# Patient Record
Sex: Female | Born: 1995 | Race: Black or African American | Hispanic: No | Marital: Single | Smoking: Current some day smoker
Health system: Southern US, Community
[De-identification: ages and names within clinical notes are randomized; demographics above are authoritative.]

## PROBLEM LIST (undated history)

## (undated) ENCOUNTER — Emergency Department (HOSPITAL_COMMUNITY): Admission: EM | Payer: Self-pay

---
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
## 2015-01-04 ENCOUNTER — Encounter (HOSPITAL_COMMUNITY): Payer: Self-pay

## 2015-01-04 ENCOUNTER — Emergency Department (HOSPITAL_COMMUNITY): Payer: Managed Care, Other (non HMO)

## 2015-01-04 ENCOUNTER — Emergency Department (HOSPITAL_COMMUNITY)
Admission: EM | Admit: 2015-01-04 | Discharge: 2015-01-05 | Disposition: A | Discharge status: Home / Self Care | Payer: Managed Care, Other (non HMO) | Attending: Emergency Medicine | Admitting: Emergency Medicine

## 2015-01-04 DIAGNOSIS — Y9389 Activity, other specified: Secondary | ICD-10-CM | POA: Insufficient documentation

## 2015-01-04 DIAGNOSIS — Y92410 Unspecified street and highway as the place of occurrence of the external cause: Secondary | ICD-10-CM | POA: Insufficient documentation

## 2015-01-04 DIAGNOSIS — Y998 Other external cause status: Secondary | ICD-10-CM | POA: Insufficient documentation

## 2015-01-04 DIAGNOSIS — S8391XA Sprain of unspecified site of right knee, initial encounter: Secondary | ICD-10-CM | POA: Insufficient documentation

## 2015-01-04 DIAGNOSIS — Z3202 Encounter for pregnancy test, result negative: Secondary | ICD-10-CM | POA: Diagnosis not present

## 2015-01-04 DIAGNOSIS — S8991XA Unspecified injury of right lower leg, initial encounter: Secondary | ICD-10-CM | POA: Diagnosis present

## 2015-01-04 LAB — POC URINE PREG, ED: Preg Test, Ur: NEGATIVE

## 2015-01-04 MED ORDER — OXYCODONE-ACETAMINOPHEN 5-325 MG PO TABS
1.0000 | ORAL_TABLET | Freq: Once | ORAL | Status: AC
  Administered 2015-01-04: 1 via ORAL
  Filled 2015-01-04: qty 1
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
## 2015-01-05 ENCOUNTER — Emergency Department (HOSPITAL_COMMUNITY): Payer: Managed Care, Other (non HMO)

## 2015-01-05 DIAGNOSIS — S8391XA Sprain of unspecified site of right knee, initial encounter: Secondary | ICD-10-CM | POA: Diagnosis not present

## 2015-01-05 MED ORDER — OXYCODONE-ACETAMINOPHEN 5-325 MG PO TABS: 1.0000 | ORAL_TABLET | Freq: Four times a day (QID) | ORAL | Status: DC | PRN

## 2015-01-05 MED ORDER — IBUPROFEN 600 MG PO TABS: 600.0000 mg | ORAL_TABLET | Freq: Four times a day (QID) | ORAL | Status: DC | PRN

## 2015-01-05 MED ORDER — OXYCODONE-ACETAMINOPHEN 5-325 MG PO TABS
1.0000 | ORAL_TABLET | Freq: Once | ORAL | Status: AC
  Administered 2015-01-05: 1 via ORAL
  Filled 2015-01-05: qty 1

## 2015-01-05 NOTE — Discharge Instructions (Signed)

## 2015-01-05 NOTE — ED Notes (Signed)
Pt was able to ambulate in hall. Pt was limping, but laughing throughout ambulation.

## 2016-10-02 ENCOUNTER — Emergency Department (HOSPITAL_COMMUNITY)
Admission: EM | Admit: 2016-10-02 | Discharge: 2016-10-02 | Disposition: A | Discharge status: Home / Self Care | Payer: Managed Care, Other (non HMO)

## 2016-10-02 NOTE — ED Notes (Signed)
Registration advised that pt told them she was going to [facility] due to wait,

## 2019-03-27 ENCOUNTER — Other Ambulatory Visit: Payer: Self-pay

## 2019-03-27 ENCOUNTER — Ambulatory Visit: Payer: BC Managed Care – PPO | Attending: Internal Medicine

## 2019-03-27 DIAGNOSIS — Z20822 Contact with and (suspected) exposure to covid-19: Secondary | ICD-10-CM | POA: Insufficient documentation

## 2019-03-28 LAB — NOVEL CORONAVIRUS, NAA: SARS-CoV-2, NAA: NOT DETECTED

## 2019-07-29 ENCOUNTER — Encounter (HOSPITAL_COMMUNITY): Payer: Self-pay | Admitting: Emergency Medicine

## 2019-07-29 ENCOUNTER — Emergency Department (HOSPITAL_COMMUNITY): Payer: BC Managed Care – PPO

## 2019-07-29 ENCOUNTER — Other Ambulatory Visit: Payer: Self-pay

## 2019-07-29 ENCOUNTER — Emergency Department (HOSPITAL_COMMUNITY)
Admission: EM | Admit: 2019-07-29 | Discharge: 2019-07-29 | Disposition: A | Discharge status: Home / Self Care | Payer: BC Managed Care – PPO | Attending: Emergency Medicine | Admitting: Emergency Medicine

## 2019-07-29 DIAGNOSIS — Y939 Activity, unspecified: Secondary | ICD-10-CM | POA: Diagnosis not present

## 2019-07-29 DIAGNOSIS — S93402A Sprain of unspecified ligament of left ankle, initial encounter: Secondary | ICD-10-CM

## 2019-07-29 DIAGNOSIS — Y999 Unspecified external cause status: Secondary | ICD-10-CM | POA: Diagnosis not present

## 2019-07-29 DIAGNOSIS — X58XXXA Exposure to other specified factors, initial encounter: Secondary | ICD-10-CM | POA: Diagnosis not present

## 2019-07-29 DIAGNOSIS — Y929 Unspecified place or not applicable: Secondary | ICD-10-CM | POA: Insufficient documentation

## 2019-07-29 DIAGNOSIS — S99912A Unspecified injury of left ankle, initial encounter: Secondary | ICD-10-CM | POA: Diagnosis present

## 2019-07-29 NOTE — Discharge Instructions (Signed)
Return if any problems.

## 2019-07-29 NOTE — ED Triage Notes (Signed)
Pt c/o left ankle swelling and pain since x 2 days, denies injury

## 2019-07-29 NOTE — ED Provider Notes (Signed)
[facility] EMERGENCY DEPARTMENT Provider Note   CSN: [id number] Arrival date & time: 07/29/19  1132     History Chief Complaint  Patient presents with  . Ankle Pain    Nancy Lyons is a 24 y.o. female.  The history is provided by the patient. No language interpreter was used.  Ankle Pain Location:  Ankle Injury: no   Pain details:    Quality:  Aching   Radiates to:  Does not radiate   Severity:  Moderate   Onset quality:  Gradual   Timing:  Constant   Progression:  Worsening Chronicity:  New Dislocation: no   Foreign body present:  No foreign bodies Prior injury to area:  Unable to specify Relieved by:  Nothing Worsened by:  Nothing Ineffective treatments:  Ice Risk factors: no known bone disorder        History reviewed. No pertinent past medical history.  There are no problems to display for this patient.   History reviewed. No pertinent surgical history.   OB History   No obstetric history on file.     History reviewed. No pertinent family history.  Social History   Tobacco Use  . Smoking status: Never Smoker  . Smokeless tobacco: Never Used  Vaping Use  . Vaping Use: Never used  Substance Use Topics  . Alcohol use: No  . Drug use: No    Home Medications Prior to Admission medications   Medication Sig Start Date End Date Taking? Authorizing Provider  ibuprofen (ADVIL,MOTRIN) 600 MG tablet Take 1 tablet (600 mg total) by mouth every 6 (six) hours as needed. 01/05/15   Horton, Mayer Masker, MD  oxyCODONE-acetaminophen (PERCOCET/ROXICET) 5-325 MG tablet Take 1 tablet by mouth every 6 (six) hours as needed for severe pain. 01/05/15   Horton, Mayer Masker, MD    Allergies    Patient has no known allergies.  Review of Systems   Review of Systems  All other systems reviewed and are negative.   Physical Exam Updated Vital Signs BP 116/72 (BP Location: Right Arm)   Pulse 64   Temp 98.1 F (36.7 C) (Oral)   Resp 16   Ht 6' (1.829 m)    Wt 102.1 kg   LMP 07/24/2019   SpO2 100%   BMI 30.52 kg/m   Physical Exam Vitals and nursing note reviewed.  Constitutional:      Appearance: She is well-developed.  HENT:     Head: Normocephalic.  Abdominal:     General: There is no distension.  Musculoskeletal:        General: Swelling and tenderness present.     Cervical back: Normal range of motion.     Comments: Swollen tender left ankle,  Pain with movement, nv and ns intact   Skin:    General: Skin is warm.     Capillary Refill: Capillary refill takes less than 2 seconds.  Neurological:     Mental Status: She is alert and oriented to person, place, and time.  Psychiatric:        Mood and Affect: Mood normal.     ED Results / Procedures / Treatments   Labs (all labs ordered are listed, but only abnormal results are displayed) Labs Reviewed - No data to display  EKG None  Radiology DG Ankle Complete Left  Result Date: 07/29/2019 CLINICAL DATA:  Acute left ankle pain and swelling without known injury. EXAM: LEFT ANKLE COMPLETE - 3+ VIEW COMPARISON:  None. FINDINGS: There is no evidence  of fracture, dislocation, or joint effusion. There is no evidence of arthropathy or other focal bone abnormality. Soft tissues are unremarkable. IMPRESSION: Negative. Electronically Signed   By: Lupita Raider M.D.   On: 07/29/2019 13:12    Procedures Procedures (including critical care time)  Medications Ordered in ED Medications - No data to display  ED Course  I have reviewed the triage vital signs and the nursing notes.  Pertinent labs & imaging results that were available during my care of the patient were reviewed by me and considered in my medical decision making (see chart for details).    MDM Rules/Calculators/A&P                          MDM: Final Clinical Impression(s) / ED Diagnoses Final diagnoses:  Sprain of left ankle, unspecified ligament, initial encounter    Rx / DC Orders ED Discharge Orders     None    An After Visit Summary was printed and given to the patient.    Elson Areas, [place] 07/29/19 1653    Bethann Berkshire, MD 07/29/19 2314

## 2019-10-02 ENCOUNTER — Other Ambulatory Visit: Payer: BC Managed Care – PPO

## 2019-10-02 ENCOUNTER — Other Ambulatory Visit: Payer: Self-pay

## 2019-10-02 DIAGNOSIS — Z20822 Contact with and (suspected) exposure to covid-19: Secondary | ICD-10-CM

## 2019-10-04 LAB — NOVEL CORONAVIRUS, NAA: SARS-CoV-2, NAA: NOT DETECTED

## 2020-07-25 ENCOUNTER — Ambulatory Visit: Payer: BC Managed Care – PPO

## 2020-09-05 ENCOUNTER — Other Ambulatory Visit: Payer: Self-pay

## 2020-09-05 ENCOUNTER — Emergency Department (HOSPITAL_COMMUNITY): Payer: Self-pay

## 2020-09-05 ENCOUNTER — Emergency Department (HOSPITAL_COMMUNITY)
Admission: EM | Admit: 2020-09-05 | Discharge: 2020-09-05 | Disposition: A | Discharge status: Home / Self Care | Payer: Self-pay | Attending: Emergency Medicine | Admitting: Emergency Medicine

## 2020-09-05 ENCOUNTER — Encounter (HOSPITAL_COMMUNITY): Payer: Self-pay | Admitting: *Deleted

## 2020-09-05 DIAGNOSIS — N898 Other specified noninflammatory disorders of vagina: Secondary | ICD-10-CM | POA: Insufficient documentation

## 2020-09-05 DIAGNOSIS — N730 Acute parametritis and pelvic cellulitis: Secondary | ICD-10-CM

## 2020-09-05 DIAGNOSIS — N739 Female pelvic inflammatory disease, unspecified: Secondary | ICD-10-CM | POA: Insufficient documentation

## 2020-09-05 DIAGNOSIS — A599 Trichomoniasis, unspecified: Secondary | ICD-10-CM | POA: Insufficient documentation

## 2020-09-05 LAB — URINALYSIS, ROUTINE W REFLEX MICROSCOPIC
Bilirubin Urine: NEGATIVE
Glucose, UA: NEGATIVE mg/dL
Hgb urine dipstick: NEGATIVE
Ketones, ur: NEGATIVE mg/dL
Nitrite: NEGATIVE
Protein, ur: NEGATIVE mg/dL
Specific Gravity, Urine: 1.02 (ref 1.005–1.030)
pH: 5 (ref 5.0–8.0)

## 2020-09-05 LAB — CBC WITH DIFFERENTIAL/PLATELET
Abs Immature Granulocytes: 0.02 10*3/uL (ref 0.00–0.07)
Basophils Absolute: 0 10*3/uL (ref 0.0–0.1)
Basophils Relative: 1 %
Eosinophils Absolute: 0.2 10*3/uL (ref 0.0–0.5)
Eosinophils Relative: 3 %
HCT: 34 % — ABNORMAL LOW (ref 36.0–46.0)
Hemoglobin: 10.3 g/dL — ABNORMAL LOW (ref 12.0–15.0)
Immature Granulocytes: 0 %
Lymphocytes Relative: 32 %
Lymphs Abs: 1.9 10*3/uL (ref 0.7–4.0)
MCH: 23.8 pg — ABNORMAL LOW (ref 26.0–34.0)
MCHC: 30.3 g/dL (ref 30.0–36.0)
MCV: 78.7 fL — ABNORMAL LOW (ref 80.0–100.0)
Monocytes Absolute: 0.6 10*3/uL (ref 0.1–1.0)
Monocytes Relative: 10 %
Neutro Abs: 3.3 10*3/uL (ref 1.7–7.7)
Neutrophils Relative %: 54 %
Platelets: 457 10*3/uL — ABNORMAL HIGH (ref 150–400)
RBC: 4.32 MIL/uL (ref 3.87–5.11)
RDW: 17.9 % — ABNORMAL HIGH (ref 11.5–15.5)
WBC: 6.1 10*3/uL (ref 4.0–10.5)
nRBC: 0 % (ref 0.0–0.2)

## 2020-09-05 LAB — BASIC METABOLIC PANEL
Anion gap: 4 — ABNORMAL LOW (ref 5–15)
BUN: 9 mg/dL (ref 6–20)
CO2: 24 mmol/L (ref 22–32)
Calcium: 8.8 mg/dL — ABNORMAL LOW (ref 8.9–10.3)
Chloride: 106 mmol/L (ref 98–111)
Creatinine, Ser: 0.74 mg/dL (ref 0.44–1.00)
GFR, Estimated: >60 mL/min (ref >60)
Glucose, Bld: 108 mg/dL — ABNORMAL HIGH (ref 70–99)
Potassium: 3.9 mmol/L (ref 3.5–5.1)
Sodium: 134 mmol/L — ABNORMAL LOW (ref 135–145)

## 2020-09-05 LAB — ABO/RH: ABO/RH(D): B POS

## 2020-09-05 LAB — WET PREP, GENITAL
Sperm: NONE SEEN
Yeast Wet Prep HPF POC: NONE SEEN

## 2020-09-05 LAB — PREGNANCY, URINE: Preg Test, Ur: NEGATIVE

## 2020-09-05 LAB — HCG, QUANTITATIVE, PREGNANCY: hCG, Beta Chain, Quant, S: 1 m[IU]/mL (ref <5)

## 2020-09-05 MED ORDER — METRONIDAZOLE 500 MG PO TABS: 500.0000 mg | ORAL_TABLET | Freq: Two times a day (BID) | ORAL | 0 refills | Status: DC

## 2020-09-05 MED ORDER — DOXYCYCLINE HYCLATE 100 MG PO CAPS: 100.0000 mg | ORAL_CAPSULE | Freq: Two times a day (BID) | ORAL | 0 refills | Status: DC

## 2020-09-05 MED ORDER — CEFTRIAXONE SODIUM 500 MG IJ SOLR
500.0000 mg | Freq: Once | INTRAMUSCULAR | Status: AC
  Administered 2020-09-05: 500 mg via INTRAMUSCULAR
  Filled 2020-09-05: qty 500

## 2020-09-05 MED ORDER — STERILE WATER FOR INJECTION IJ SOLN
INTRAMUSCULAR | Status: AC
  Filled 2020-09-05: qty 10

## 2020-09-05 MED ORDER — IOHEXOL 350 MG/ML SOLN
85.0000 mL | Freq: Once | INTRAVENOUS | Status: AC | PRN
  Administered 2020-09-05: 85 mL via INTRAVENOUS

## 2020-09-05 MED ORDER — DEXTROSE 5 % IV SOLN
500.0000 mg | Freq: Once | INTRAVENOUS | Status: DC
  Filled 2020-09-05: qty 500

## 2020-09-05 NOTE — ED Provider Notes (Signed)
[facility] EMERGENCY DEPARTMENT Provider Note   CSN: [id number] Arrival date & time: 09/05/20  1330     History Chief Complaint  Patient presents with   Urinary Frequency   Abdominal Pain    Nancy Lyons is a 25 y.o. female.   Urinary Frequency Associated symptoms include abdominal pain. Pertinent negatives include no chest pain, no headaches and no shortness of breath.  Abdominal Pain Associated symptoms: vaginal discharge   Associated symptoms: no chest pain, no chills, no diarrhea, no dysuria, no fatigue, no fever, no hematuria, no nausea, no shortness of breath, no sore throat, no vaginal bleeding and no vomiting       Nancy Lyons is a 25 y.o. female who presents to the Emergency Department complaining of symptoms of pain with urination and vaginal spotting with intercourse and right lower quadrant abdominal pain.  She states that she has had symptoms for several days.  She endorses increased urinary frequency without burning.  She notes having some increased yellow vaginal discharge as well.  New sexual partner since May.  Recently unprotected intercourse with same person.  Abdominal pain is nonradiating.  No flank pain, fever or chills.  States that nothing makes her abdominal pain better or worse.  She denies abnormal menses, genital lesions and back pain.  Unsure if she may be pregnant.    History reviewed. No pertinent past medical history.  There are no problems to display for this patient.   History reviewed. No pertinent surgical history.   OB History   No obstetric history on file.     No family history on file.  Social History   Tobacco Use   Smoking status: Never   Smokeless tobacco: Never  Vaping Use   Vaping Use: Never used  Substance Use Topics   Alcohol use: No   Drug use: No    Home Medications Prior to Admission medications   Not on File    Allergies    Patient has no known allergies.  Review of Systems   Review of  Systems  Constitutional:  Negative for chills, fatigue and fever.  HENT:  Negative for sore throat and trouble swallowing.   Respiratory:  Negative for shortness of breath.   Cardiovascular:  Negative for chest pain.  Gastrointestinal:  Positive for abdominal pain. Negative for blood in stool, diarrhea, nausea and vomiting.  Genitourinary:  Positive for frequency and vaginal discharge. Negative for decreased urine volume, difficulty urinating, dysuria, flank pain, hematuria and vaginal bleeding.  Musculoskeletal:  Negative for arthralgias, back pain, myalgias, neck pain and neck stiffness.  Skin:  Negative for rash.  Neurological:  Negative for dizziness, syncope, weakness, numbness and headaches.  Hematological:  Does not bruise/bleed easily.  Psychiatric/Behavioral:  Negative for confusion.    Physical Exam Updated Vital Signs BP 109/61   Pulse 62   Temp 98 F (36.7 C) (Oral)   Resp 18   SpO2 95%   Physical Exam Vitals and nursing note reviewed. Exam conducted with a chaperone present.  Constitutional:      General: She is not in acute distress.    Appearance: Normal appearance. She is well-developed. She is not ill-appearing.  HENT:     Mouth/Throat:     Mouth: Mucous membranes are moist.  Cardiovascular:     Rate and Rhythm: Normal rate and regular rhythm.     Pulses: Normal pulses.  Pulmonary:     Effort: Pulmonary effort is normal.     Breath sounds: Normal breath  sounds. No wheezing.  Abdominal:     Palpations: Abdomen is soft.     Tenderness: There is abdominal tenderness. There is no guarding or rebound.     Comments: Mild tenderness to palpation of the right lower quadrant.  No guarding or rebound tenderness.  No CVA tenderness on exam.  Remaining abdomen is soft nontender.  Genitourinary:    Exam position: Supine.     Labia:        Right: No rash or lesion.        Left: No rash or lesion.      Vagina: Vaginal discharge present.     Cervix: Cervical motion  tenderness present. No cervical bleeding.     Uterus: Normal. Not enlarged.      Adnexa:        Right: Tenderness present. No mass.         Left: No mass or tenderness.       Comments: Copious amounts of thin white vaginal discharge present on exam. Right adnexal tenderness without appreciable fullness.  Some cervical motion tenderness also appreciated on exam. Musculoskeletal:        General: Normal range of motion.     Cervical back: Normal range of motion and neck supple.  Skin:    General: Skin is warm.     Findings: No rash.  Neurological:     General: No focal deficit present.     Mental Status: She is alert.     Sensory: No sensory deficit.     Motor: No weakness.    ED Results / Procedures / Treatments   Labs (all labs ordered are listed, but only abnormal results are displayed) Labs Reviewed  URINALYSIS, ROUTINE W REFLEX MICROSCOPIC - Abnormal; Notable for the following components:      Result Value   Leukocytes,Ua TRACE (*)    Bacteria, UA RARE (*)    All other components within normal limits  CBC WITH DIFFERENTIAL/PLATELET - Abnormal; Notable for the following components:   Hemoglobin 10.3 (*)    HCT 34.0 (*)    MCV 78.7 (*)    MCH 23.8 (*)    RDW 17.9 (*)    Platelets 457 (*)    All other components within normal limits  BASIC METABOLIC PANEL - Abnormal; Notable for the following components:   Sodium 134 (*)    Glucose, Bld 108 (*)    Calcium 8.8 (*)    Anion gap 4 (*)    All other components within normal limits  WET PREP, GENITAL  PREGNANCY, URINE  HCG, QUANTITATIVE, PREGNANCY  RPR  ABO/RH  GC/CHLAMYDIA PROBE AMP ([place]) NOT AT [facility]    EKG None  Radiology CT ABDOMEN PELVIS W CONTRAST  Result Date: 09/05/2020 CLINICAL DATA:  Right lower quadrant pain, appendicitis suspected, urinary frequency EXAM: CT ABDOMEN AND PELVIS WITH CONTRAST TECHNIQUE: Multidetector CT imaging of the abdomen and pelvis was performed using the standard protocol  following bolus administration of intravenous contrast. CONTRAST:  79mL OMNIPAQUE IOHEXOL 350 MG/ML SOLN COMPARISON:  None. FINDINGS: Lower chest: No acute abnormality. Hepatobiliary: No solid liver abnormality is seen. No gallstones, gallbladder wall thickening, or biliary dilatation. Pancreas: Unremarkable. No pancreatic ductal dilatation or surrounding inflammatory changes. Spleen: Normal in size without significant abnormality. Adrenals/Urinary Tract: Adrenal glands are unremarkable. Kidneys are normal, without renal calculi, solid lesion, or hydronephrosis. Bladder is unremarkable. Stomach/Bowel: Stomach is within normal limits. Appendix appears normal (series 2, image 65). No evidence of bowel wall thickening,  distention, or inflammatory changes. Vascular/Lymphatic: No significant vascular findings are present. No enlarged abdominal or pelvic lymph nodes. Reproductive: No mass or other significant abnormality. Small bilateral ovarian cysts and follicles. Other: No abdominal wall hernia or abnormality. No abdominopelvic ascites. Musculoskeletal: No acute or significant osseous findings. IMPRESSION: No acute CT findings of the abdomen or pelvis to explain right lower quadrant pain. Normal appendix. Electronically Signed   By: Lauralyn Primes M.D.   On: 09/05/2020 17:40    Procedures Procedures   Medications Ordered in ED Medications  cefTRIAXone (ROCEPHIN) 500 mg in dextrose 5 % 50 mL IVPB (has no administration in time range)  iohexol (OMNIPAQUE) 350 MG/ML injection 85 mL (85 mLs Intravenous Contrast Given 09/05/20 1721)    ED Course  I have reviewed the triage vital signs and the nursing notes.  Pertinent labs & imaging results that were available during my care of the patient were reviewed by me and considered in my medical decision making (see chart for details).    MDM Rules/Calculators/A&P                           Patient here with complaint of right lower abdominal pain, pain with  urination and vaginal spotting during intercourse.  She also endorses having increased vaginal discharge.  No fever or vomiting.  On exam, patient does have some mild tenderness of the right lower quadrant.  No guarding or rebound tenderness.  Abdomen is soft.  Vital signs are reassuring.  Patient does not appear toxic.  Given that she does have right lower quadrant pain, I will proceed with labs and CT of the abdomen pelvis.  I do feel that her symptoms are most likely GU related.  CT abdomen pelvis without acute findings.  Appendix is normal.  Labs interpreted by me, no leukocytosis.  Hemoglobin 10.3, near patient baseline.  No significant electrolyte derangement.  Patient is not pregnant.  Urinalysis without evidence of infection.  Pelvic exam reveals excessive thin white vaginal discharge and tenderness at the right adnexa and cervical motion tenderness.  There is no palpable masses.  Wet prep shows trichomonas.  I suspect patient has mild PID.  Plan includes treatment here with Rocephin and prescriptions for Flagyl and doxycycline.  Discussed that sexual partners will also need to be tested and/or treated.  Patient verbalized understanding.  She is nontoxic-appearing and I feel she is appropriate for discharge home.  Discussed need for follow-up with [facility] or health department for recheck.  Final Clinical Impression(s) / ED Diagnoses Final diagnoses:  PID (acute pelvic inflammatory disease)  Trichomonosis    Rx / DC Orders ED Discharge Orders     None        Rosey Bath 09/06/20 2125    Bethann Berkshire, MD 09/12/20 [phone]

## 2020-09-05 NOTE — Discharge Instructions (Addendum)
Your test today showed that you likely have a pelvic infection and trichomonas.  You have been treated with antibiotics here and prescriptions written for 2 additional antibiotics that you will need to take as directed until they are finished.  Follow-up with your primary care provider or with the health department for recheck.  All sexual partners will need to be tested as well.  This may be done at the health department.

## 2020-09-05 NOTE — ED Provider Notes (Signed)
Emergency Medicine Provider Triage Evaluation Note  Nancy Lyons , a 25 y.o. female  was evaluated in triage.  Pt complains of right lower quadrant discomfort and dysuria, this has been going on for approximately 5 days, she states it is worse when she has intercourse, there is also spotting with intercourse, she is not using any protection.  Review of Systems  Positive: Abdominal discomfort, dysuria Negative: Diarrhea, fever, chills, cough, shortness of breath  Physical Exam  BP 111/71   Pulse 70   Temp 98.4 F (36.9 C) (Oral)   Resp 18   SpO2 96%  Gen:   Awake, no distress no distress, well-appearing Resp:  Normal effort normal lung sounds MSK:   Moves extremities without difficulty no edema Abdomen:  Mild tenderness right lower quadrant, no CVA tenderness Neurologic:  Normal speech and coordination Medical Decision Making  Medically screening exam initiated at 2:20 PM.  Appropriate orders placed.  Nancy Lyons was informed that the remainder of the evaluation will be completed by another provider, this initial triage assessment does not replace that evaluation, and the importance of remaining in the ED until their evaluation is complete.  Needs work-up for possible pregnancy, possible urinary tract infection, possible ovarian pathology, this does not seem consistent with ovarian torsion, less likely to be appendicitis given the triggers of the pain including dysuria and intercourse.  Consider ectopic, labs ordered   Eber Hong, MD 09/05/20 1421

## 2020-09-05 NOTE — ED Triage Notes (Signed)
Right lower quadrant pain with urinary frequency

## 2020-09-06 LAB — GC/CHLAMYDIA PROBE AMP (~~LOC~~) NOT AT ARMC
Chlamydia: POSITIVE — AB
Comment: NEGATIVE
Comment: NORMAL
Neisseria Gonorrhea: NEGATIVE

## 2020-09-06 LAB — RPR: RPR Ser Ql: NONREACTIVE

## 2021-04-16 ENCOUNTER — Emergency Department (HOSPITAL_COMMUNITY)
Admission: EM | Admit: 2021-04-16 | Discharge: 2021-04-17 | Disposition: A | Discharge status: Home / Self Care | Payer: BC Managed Care – PPO | Attending: Emergency Medicine | Admitting: Emergency Medicine

## 2021-04-16 ENCOUNTER — Other Ambulatory Visit: Payer: Self-pay

## 2021-04-16 ENCOUNTER — Encounter (HOSPITAL_COMMUNITY): Payer: Self-pay | Admitting: *Deleted

## 2021-04-16 DIAGNOSIS — H5712 Ocular pain, left eye: Secondary | ICD-10-CM | POA: Diagnosis present

## 2021-04-16 DIAGNOSIS — B9789 Other viral agents as the cause of diseases classified elsewhere: Secondary | ICD-10-CM | POA: Diagnosis not present

## 2021-04-16 DIAGNOSIS — H1089 Other conjunctivitis: Secondary | ICD-10-CM | POA: Insufficient documentation

## 2021-04-16 DIAGNOSIS — H1032 Unspecified acute conjunctivitis, left eye: Secondary | ICD-10-CM

## 2021-04-16 MED ORDER — FLUORESCEIN SODIUM 1 MG OP STRP
1.0000 | ORAL_STRIP | Freq: Once | OPHTHALMIC | Status: AC
  Administered 2021-04-17: 1 via OPHTHALMIC
  Filled 2021-04-16: qty 1

## 2021-04-16 MED ORDER — TETRACAINE HCL 0.5 % OP SOLN
2.0000 [drp] | Freq: Once | OPHTHALMIC | Status: AC
  Administered 2021-04-17: 2 [drp] via OPHTHALMIC
  Filled 2021-04-16: qty 4

## 2021-04-16 NOTE — ED Triage Notes (Signed)
Pt with left eye pain since yesterday.  Redness and swelling noted to left eye.  Pt states it was crusted over this morning.  ?

## 2021-04-17 MED ORDER — ERYTHROMYCIN 5 MG/GM OP OINT
TOPICAL_OINTMENT | Freq: Three times a day (TID) | OPHTHALMIC | Status: DC
  Administered 2021-04-17: 1 via OPHTHALMIC
  Filled 2021-04-17: qty 3.5

## 2021-04-17 NOTE — ED Provider Notes (Signed)
?  [facility] EMERGENCY DEPARTMENT ?Provider Note ? ? ?CSN: [id number] ?Arrival date & time: 04/16/21  2007 ? ?  ? ?History ? ?Chief Complaint  ?Patient presents with  ? Eye Pain  ? ? ?Nancy Lyons is a 26 y.o. female. ? ?The history is provided by the patient.  ?Eye Pain ?This is a new problem. The current episode started yesterday. The problem occurs constantly. The problem has been gradually worsening. Nothing aggravates the symptoms. Nothing relieves the symptoms.  ?Patient reports over a day ago she began having swelling, redness and drainage from her left eye.  No trauma.  She does not wear contact ?It occurred at work but she does not recall getting any foreign bodies in her eye. ?No previous eye surgery ?  ? ?Home Medications ?Prior to Admission medications   ?Not on File  ?   ? ?Allergies    ?Patient has no known allergies.   ? ?Review of Systems   ?Review of Systems  ?Constitutional:  Negative for fever.  ?Eyes:  Positive for pain.  ? ?Physical Exam ?Updated Vital Signs ?BP (!) 111/59 (BP Location: Right Arm)   Pulse (!) 56   Temp 97.6 ?F (36.4 ?C) (Oral)   Resp 20   Ht 1.829 m (6')   Wt 112.5 kg   LMP 04/09/2021   SpO2 100%   BMI 33.63 kg/m?  ?Physical Exam ?CONSTITUTIONAL: Well developed/well nourished ?HEAD: Normocephalic/atraumatic ?EYES: EOMI/PERRL, OS-no corneal abrasion, no foreign bodies.  No corneal haziness.  Mild erythema is noted.  Crust is noted on the eyelashes.  No proptosis.  No evidence of keratitis ?Visual acuity 20/20 bilaterally ?NEURO: Pt is awake/alert/appropriate, moves all extremitiesx4.  No facial droop.   ?EXTREMITIES: full ROM ?SKIN: warm, color normal ?PSYCH: no abnormalities of mood noted, alert and oriented to situation ? ?ED Results / Procedures / Treatments   ?Labs ?(all labs ordered are listed, but only abnormal results are displayed) ?Labs Reviewed - No data to display ? ?EKG ?None ? ?Radiology ?No results found. ? ?Procedures ?Procedures  ? ? ?Medications  Ordered in ED ?Medications  ?erythromycin ophthalmic ointment (has no administration in time range)  ?fluorescein ophthalmic strip 1 strip (1 strip Both Eyes Given 04/17/21 0032)  ?tetracaine (PONTOCAINE) 0.5 % ophthalmic solution 2 drop (2 drops Both Eyes Given 04/17/21 0032)  ? ? ?ED Course/ Medical Decision Making/ A&P ?  ?                        ?Medical Decision Making ?Risk ?Prescription drug management. ? ? ?Patient presents with left eye pain and swelling.  She reports she wakes up with crusting drainage from left eye.  We will treat for acute conjunctivitis.  Patient will be given erythromycin at discharge.  If no improvement in a week she can follow-up with ophthalmology ? ? ? ? ? ? ? ?Final Clinical Impression(s) / ED Diagnoses ?Final diagnoses:  ?Acute bacterial conjunctivitis of left eye  ? ? ?Rx / DC Orders ?ED Discharge Orders   ? ? None  ? ?  ? ? ?  ?Zadie Rhine, MD ?04/17/21 [phone] ? ?

## 2021-04-17 NOTE — ED Notes (Signed)
Visual acuity checked better than 20/20 both eyes ?

## 2021-12-23 IMAGING — DX DG ANKLE COMPLETE 3+V*L*
3 series · 3 of 3 positions shown · non-contrast
Comparison: None.

CLINICAL DATA: Acute left ankle pain and swelling without known
injury.

EXAM:
LEFT ANKLE COMPLETE - 3+ VIEW

[ankle ap]
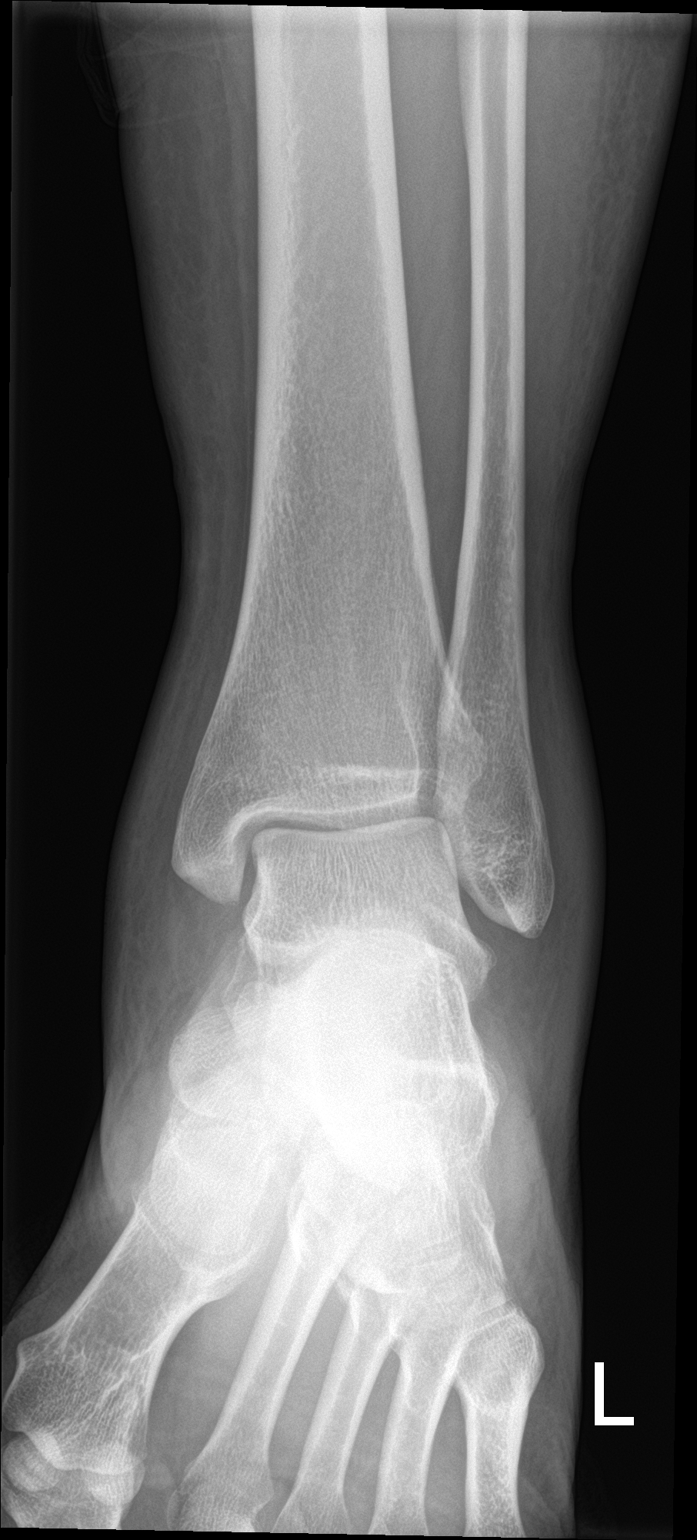

[ankle obl]
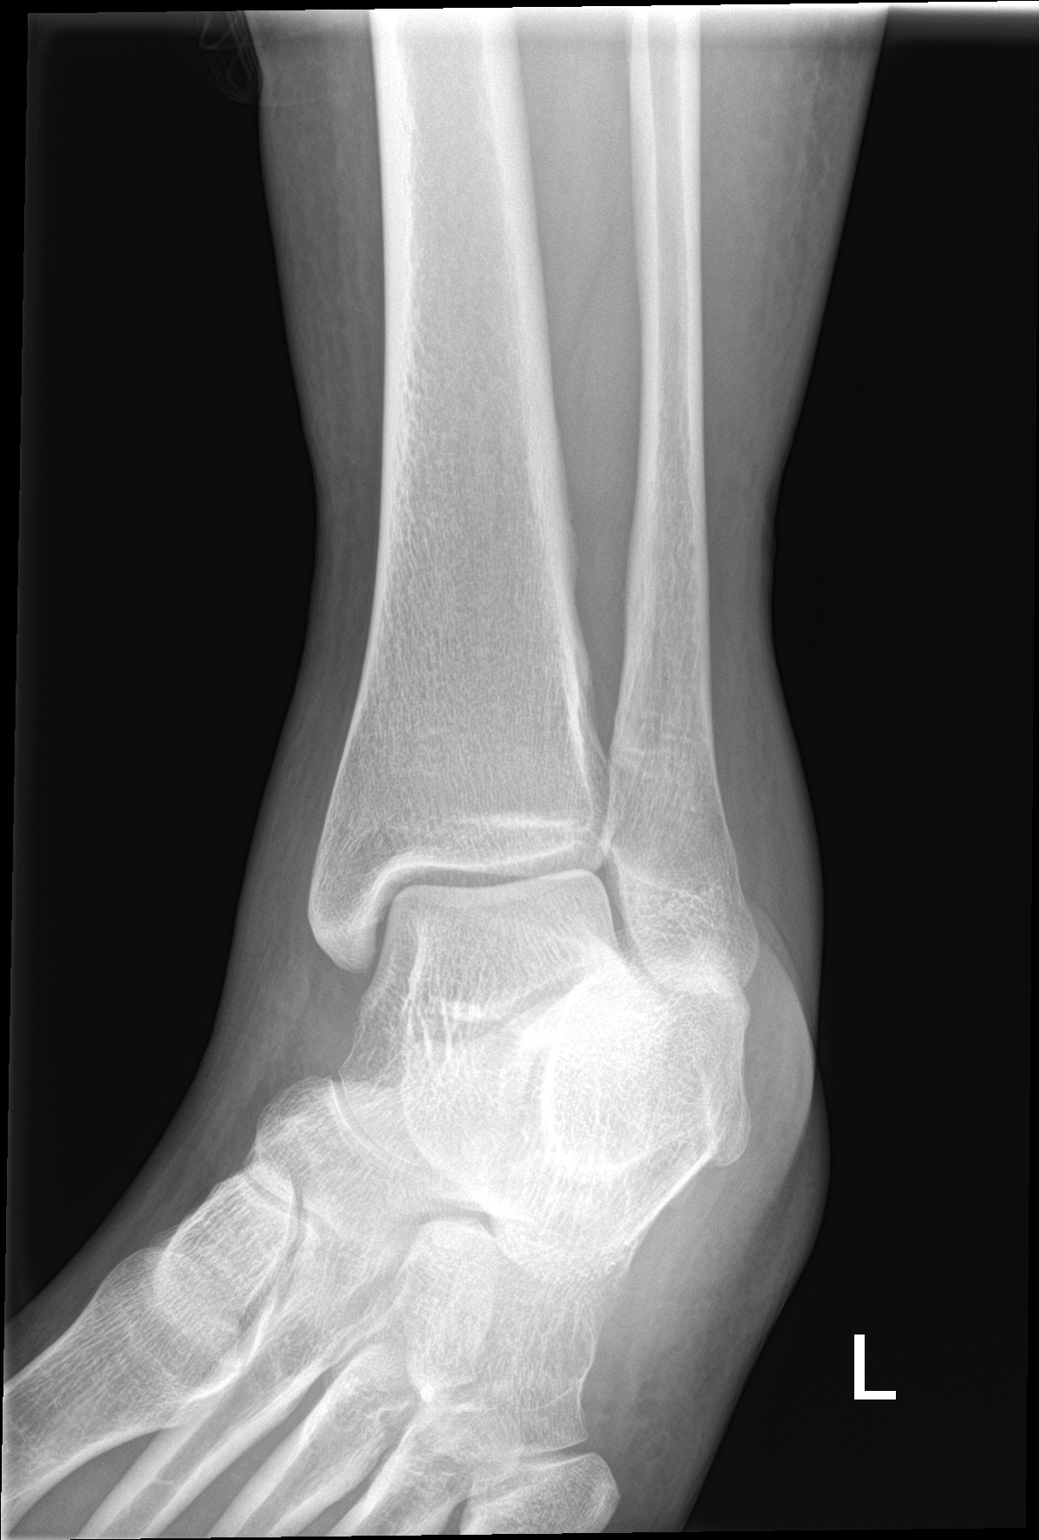

[ankle lat]
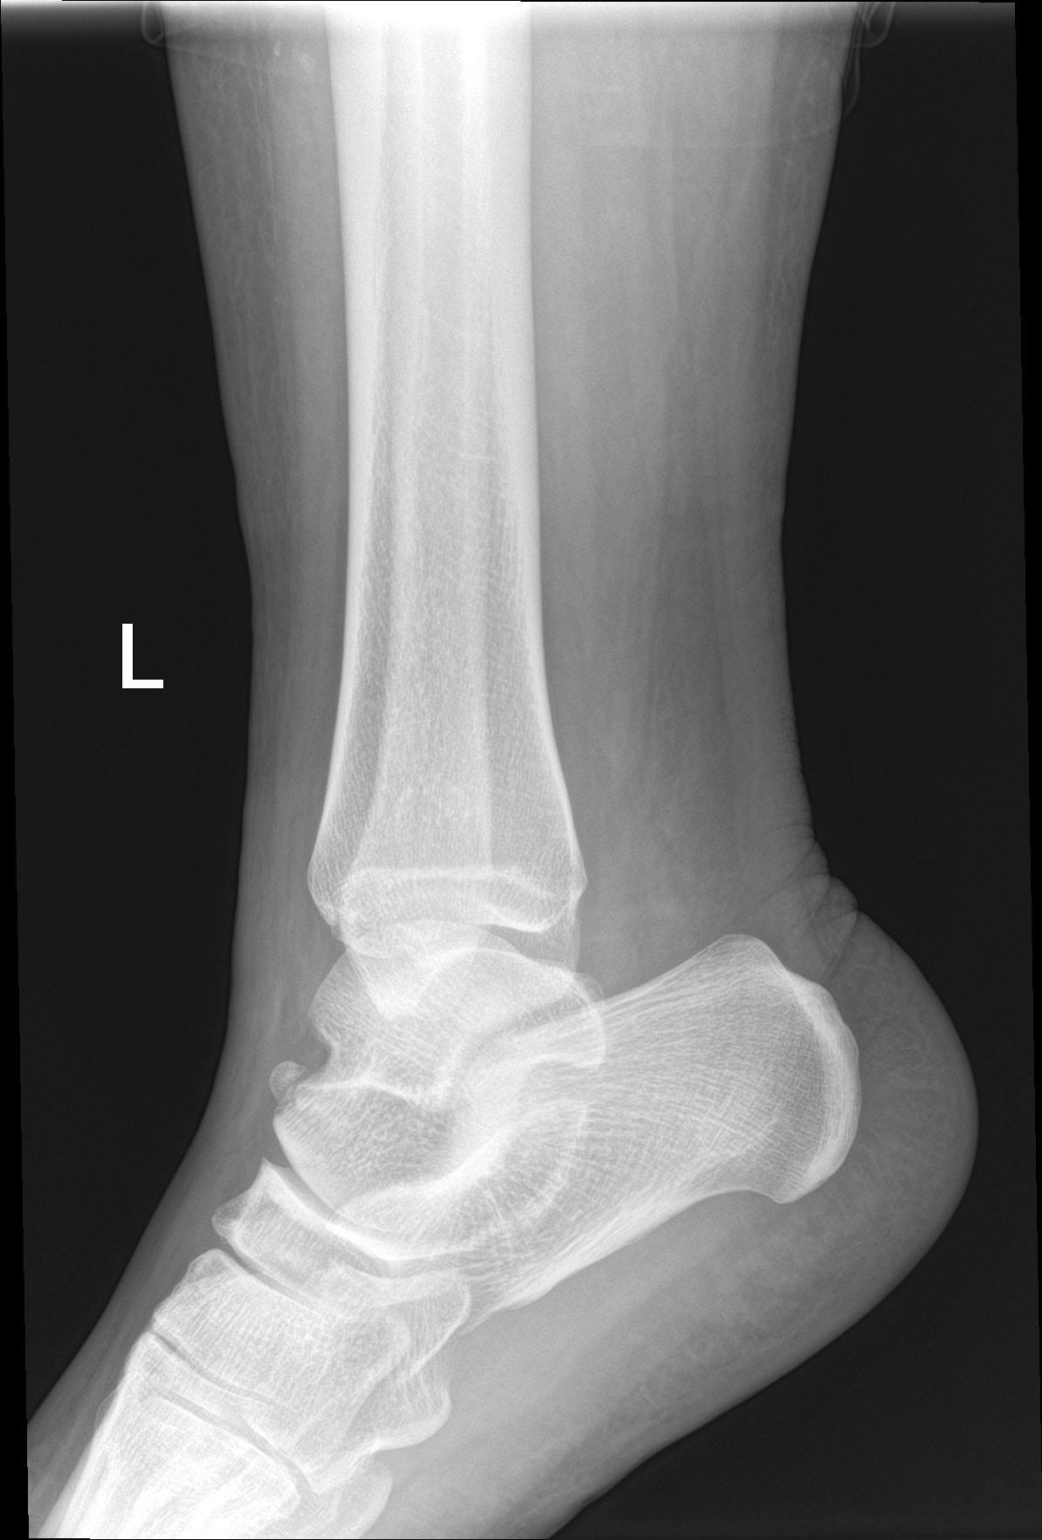

[3 of 3 positions shown; findings below may reference images not displayed]

FINDINGS: There is no evidence of fracture, dislocation, or joint effusion.
There is no evidence of arthropathy or other focal bone abnormality.
Soft tissues are unremarkable.
IMPRESSION: Negative.

## 2022-04-06 ENCOUNTER — Emergency Department (HOSPITAL_COMMUNITY)
Admission: EM | Admit: 2022-04-06 | Discharge: 2022-04-06 | Disposition: A | Discharge status: Home / Self Care | Payer: BC Managed Care – PPO | Attending: Emergency Medicine | Admitting: Emergency Medicine

## 2022-04-06 ENCOUNTER — Emergency Department (HOSPITAL_COMMUNITY): Payer: BC Managed Care – PPO

## 2022-04-06 ENCOUNTER — Other Ambulatory Visit: Payer: Self-pay

## 2022-04-06 DIAGNOSIS — W230XXA Caught, crushed, jammed, or pinched between moving objects, initial encounter: Secondary | ICD-10-CM | POA: Insufficient documentation

## 2022-04-06 DIAGNOSIS — S6992XA Unspecified injury of left wrist, hand and finger(s), initial encounter: Secondary | ICD-10-CM | POA: Diagnosis present

## 2022-04-06 DIAGNOSIS — S60222A Contusion of left hand, initial encounter: Secondary | ICD-10-CM | POA: Insufficient documentation

## 2022-04-06 MED ORDER — CEPHALEXIN 500 MG PO CAPS: ORAL_CAPSULE | ORAL | 0 refills | Status: DC

## 2022-04-06 MED ORDER — IBUPROFEN 800 MG PO TABS: 800.0000 mg | ORAL_TABLET | Freq: Three times a day (TID) | ORAL | 0 refills | Status: DC | PRN

## 2022-04-06 NOTE — ED Provider Notes (Signed)
[facility] Provider Note   CSN: [id number] Arrival date & time: 04/06/22  1432     History  Chief Complaint  Patient presents with   Hand Injury    Nancy Lyons is a 27 y.o. female.  Patient has a past medical history of obesity.  Patient states that her left hand was crushed between a car and a tire when they were trying to work on the tires.  The history is provided by the patient and medical records. No language interpreter was used.  Hand Injury Upper extremity pain location: Right hand. Pain details:    Quality:  Aching   Radiates to:  Does not radiate   Severity:  Moderate   Onset quality:  Sudden   Timing:  Constant Handedness:  Right-handed Dislocation: no   Foreign body present:  No foreign bodies Associated symptoms: no back pain and no fatigue    Contusion to left hand.  She is given Keflex to prevent infection.  She has a small nonsuturable laceration.  Also she is given Motrin and will follow-up with orthopedics    Home Medications Prior to Admission medications   Medication Sig Start Date End Date Taking? Authorizing Provider  cephALEXin (KEFLEX) 500 MG capsule Take on bid 04/06/22  Yes Milton Ferguson, MD  ibuprofen (ADVIL) 800 MG tablet Take 1 tablet (800 mg total) by mouth every 8 (eight) hours as needed for moderate pain. 04/06/22  Yes Milton Ferguson, MD      Allergies    Patient has no known allergies.    Review of Systems   Review of Systems  Constitutional:  Negative for appetite change and fatigue.  HENT:  Negative for congestion, ear discharge and sinus pressure.   Eyes:  Negative for discharge.  Respiratory:  Negative for cough.   Cardiovascular:  Negative for chest pain.  Gastrointestinal:  Negative for abdominal pain and diarrhea.  Genitourinary:  Negative for frequency and hematuria.  Musculoskeletal:  Negative for back pain.       Left hand pain  Skin:  Negative for rash.  Neurological:   Negative for seizures and headaches.  Psychiatric/Behavioral:  Negative for hallucinations.     Physical Exam Updated Vital Signs BP 125/71   Pulse 97   Temp 98.1 F (36.7 C) (Oral)   Resp 17   Ht 6' (1.829 m)   Wt 115.2 kg   LMP 03/28/2022   SpO2 96%   BMI 34.45 kg/m  Physical Exam Vitals and nursing note reviewed.  Constitutional:      Appearance: She is well-developed.  HENT:     Head: Normocephalic.     Nose: Nose normal.  Eyes:     Conjunctiva/sclera: Conjunctivae normal.  Neck:     Trachea: No tracheal deviation.  Cardiovascular:     Rate and Rhythm: Normal rate.     Heart sounds: No murmur heard. Pulmonary:     Effort: Pulmonary effort is normal.  Musculoskeletal:        General: Normal range of motion.     Comments: Swollen left TM with small nonsuturable laceration posteriorly  Skin:    General: Skin is warm.  Neurological:     Mental Status: She is alert and oriented to person, place, and time.     ED Results / Procedures / Treatments   Labs (all labs ordered are listed, but only abnormal results are displayed) Labs Reviewed - No data to display  EKG None  Radiology  DG Hand Complete Left  Result Date: 04/06/2022 CLINICAL DATA:  Left hand pain after injury. EXAM: LEFT HAND - COMPLETE 3+ VIEW COMPARISON:  None Available. FINDINGS: There is no evidence of fracture or dislocation. There is no evidence of arthropathy or other focal bone abnormality. Soft tissues are unremarkable. IMPRESSION: Negative. Electronically Signed   By: Marijo Conception M.D.   On: 04/06/2022 15:29    Procedures Procedures    Medications Ordered in ED Medications - No data to display  ED Course/ Medical Decision Making/ A&P                             Medical Decision Making Amount and/or Complexity of Data Reviewed Radiology: ordered.  Risk Prescription drug management.  Patient with contusion to left hand.  She is given pain medicines and an Ace wrap and will  follow-up with orthopedics.  Patient also put on Keflex to prevent infection from the small laceration        Final Clinical Impression(s) / ED Diagnoses Final diagnoses:  Contusion of left hand, initial encounter    Rx / DC Orders ED Discharge Orders          Ordered    cephALEXin (KEFLEX) 500 MG capsule        04/06/22 1602    ibuprofen (ADVIL) 800 MG tablet  Every 8 hours PRN        04/06/22 1602              Milton Ferguson, MD 04/07/22 1207

## 2022-04-06 NOTE — ED Triage Notes (Signed)
States a she was helping to change a tire on a car when the car fell off the jack & onto her left hand about 20 minutes ago. States had to jack the car back up to get it off her hand.

## 2022-04-06 NOTE — Discharge Instructions (Addendum)
Follow-up in 1 to 2 weeks for recheck.  He can follow-up with Dr. Aline Brochure or one of his colleagues.  Or you also have been referred to Dr. Alvan Dame in [place]

## 2022-04-07 ENCOUNTER — Emergency Department (HOSPITAL_COMMUNITY)
Admission: EM | Admit: 2022-04-07 | Discharge: 2022-04-07 | Discharge status: Against Medical Advice | Payer: BC Managed Care – PPO | Attending: Emergency Medicine | Admitting: Emergency Medicine

## 2022-04-07 DIAGNOSIS — M79642 Pain in left hand: Secondary | ICD-10-CM | POA: Diagnosis present

## 2022-04-07 DIAGNOSIS — Z5321 Procedure and treatment not carried out due to patient leaving prior to being seen by health care provider: Secondary | ICD-10-CM | POA: Insufficient documentation

## 2022-04-12 ENCOUNTER — Ambulatory Visit (INDEPENDENT_AMBULATORY_CARE_PROVIDER_SITE_OTHER): Payer: BC Managed Care – PPO | Admitting: Orthopaedic Surgery

## 2022-04-12 ENCOUNTER — Encounter: Payer: Self-pay | Admitting: Orthopaedic Surgery

## 2022-04-12 VITALS — BP 117/79 | HR 65 | Ht 72.0 in | Wt 250.8 lb

## 2022-04-12 DIAGNOSIS — S6702XA Crushing injury of left thumb, initial encounter: Secondary | ICD-10-CM | POA: Diagnosis not present

## 2022-04-12 MED ORDER — NAPROXEN 500 MG PO TABS: 500.0000 mg | ORAL_TABLET | Freq: Two times a day (BID) | ORAL | 5 refills | Status: DC

## 2022-04-12 NOTE — Progress Notes (Signed)
Subjective:    Patient ID: Nancy Lyons, female    DOB: March 08, 1995, 27 y.o.   MRN: [id number]  HPI She was changing a car tire when the jack collapsed.  A part of the jack hit her left nondominant hand very hard on the thumb and dorsal part of the hand in the web area.  She had abrasions and pain.  This happened on 04-06-22.  She was seen in the ER.  She was evaluated and had X-rays.  X-rays were negative.  She has persistent pain and swelling of the left hand over the thumb area and the web space.  She went to work but is not doing well.  She works in the cold room at work.  She has no other trauma. She has tried ice, ibuprofen and rest with little help. She has slight numbness of the thumb.   Review of Systems  Constitutional:  Positive for activity change.  Musculoskeletal:  Positive for arthralgias and joint swelling.  All other systems reviewed and are negative. For Review of Systems, all other systems reviewed and are negative.  The following is a summary of the past history medically, past history surgically, known current medicines, social history and family history.  This information is gathered electronically by the computer from prior information and documentation.  I review this each visit and have found including this information at this point in the chart is beneficial and informative.   History reviewed. No pertinent past medical history.  History reviewed. No pertinent surgical history.  No current outpatient medications on file prior to visit.   No current facility-administered medications on file prior to visit.    Social History   Socioeconomic History   Marital status: Single    Spouse name: Not on file   Number of children: Not on file   Years of education: Not on file   Highest education level: Not on file  Occupational History   Not on file  Tobacco Use   Smoking status: Never   Smokeless tobacco: Never  Vaping Use   Vaping Use: Never used   Substance and Sexual Activity   Alcohol use: No   Drug use: No   Sexual activity: Not on file  Other Topics Concern   Not on file  Social History Narrative   Not on file   Social Determinants of Health   Financial Resource Strain: Not on file  Food Insecurity: Not on file  Transportation Needs: Not on file  Physical Activity: Not on file  Stress: Not on file  Social Connections: Not on file  Intimate Partner Violence: Not on file    History reviewed. No pertinent family history.  BP 117/79   Pulse 65   Ht 6' (1.829 m)   Wt 250 lb 12.8 oz (113.8 kg)   LMP 03/28/2022   BMI 34.01 kg/m   Body mass index is 34.01 kg/m.      Objective:   Physical Exam Vitals and nursing note reviewed. Exam conducted with a chaperone present.  Constitutional:      Appearance: She is well-developed.  HENT:     Head: Normocephalic and atraumatic.  Eyes:     Conjunctiva/sclera: Conjunctivae normal.     Pupils: Pupils are equal, round, and reactive to light.  Cardiovascular:     Rate and Rhythm: Normal rate and regular rhythm.  Pulmonary:     Effort: Pulmonary effort is normal.  Abdominal:     Palpations: Abdomen is soft.  Musculoskeletal:  Hands:     Cervical back: Normal range of motion and neck supple.  Skin:    General: Skin is warm and dry.  Neurological:     Mental Status: She is alert and oriented to person, place, and time.     Cranial Nerves: No cranial nerve deficit.     Motor: No abnormal muscle tone.     Coordination: Coordination normal.     Deep Tendon Reflexes: Reflexes are normal and symmetric. Reflexes normal.  Psychiatric:        Behavior: Behavior normal.        Thought Content: Thought content normal.        Judgment: Judgment normal.    I have independently reviewed and interpreted x-rays of this patient done at another site by another physician or qualified health professional. I have reviewed the ER notes.        Assessment & Plan:    Encounter Diagnosis  Name Primary?   Crushing injury of left thumb, initial encounter Yes   I will give thumb splint.  She can work if they will let her with the splint.  Return in two weeks.  I will call in naprosyn.  Stop the ibuprofen.  Use Aspercreme, Biofreeze and Voltaren gel.  Call if any problem.  Precautions discussed.  Electronically Signed Sanjuana Kava, MD 3/13/202410:30 AM

## 2022-04-12 NOTE — Patient Instructions (Addendum)
DR.KEELING'S SCHEDULE IS AS FOLLOWS: TUESDAY: ALL DAY WEDNESDAY: MORNING ONLY THURSDAY: MORNING ONLY PLEASE CALL OR SEND A MESSAGE VIA MYCHART BY WEDNESDAY MORNING SO THAT I CAN SEND A REQUEST TO HIM. HE LEAVES THURSDAY BY 11:30AM. HE DOES NOT RETURN TO THE OFFICE UNTIL TUESDAY MORNINGS AND HE DOES NOT CHECK HIS WORK MESSAGES DURING HIS TIME AWAY FROM THE OFFICE. I'M ABBY AND IF YOU NEED SOMETHING, SEND ME A MYCHART MESSAGE OR CALL. MYCHART IS THE FASTEST WAY TO GET IN CONTACT WITH ME.   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

## 2022-04-26 ENCOUNTER — Encounter: Payer: Self-pay | Admitting: Orthopaedic Surgery

## 2022-04-26 ENCOUNTER — Ambulatory Visit (INDEPENDENT_AMBULATORY_CARE_PROVIDER_SITE_OTHER): Payer: BC Managed Care – PPO | Admitting: Orthopaedic Surgery

## 2022-04-26 VITALS — BP 134/59 | HR 69 | Ht 72.0 in | Wt 250.0 lb

## 2022-04-26 DIAGNOSIS — S6702XD Crushing injury of left thumb, subsequent encounter: Secondary | ICD-10-CM | POA: Diagnosis not present

## 2022-04-26 NOTE — Patient Instructions (Signed)
You are improving but this will take time to heal. The "raised" area between your thumb and index finger is bruised and has blood under the skin. You can soak your hand in WARM water to help get that to go down.  Provide work note: limited lifting until next appt  Follow up: 2 weeks

## 2022-04-26 NOTE — Progress Notes (Signed)
I am a little better.  She has less pain of the left thumb and web space.  She is using her hand more.  She has a small hematoma in the web space area dorsally on the left.  NV intact. ROM is improved but still not full of the left thumb.  Encounter Diagnosis  Name Primary?   Crushing injury of left thumb, subsequent encounter Yes   Continue as she is doing.  Return in two weeks.  Call if any problem.  Precautions discussed.  Electronically Signed Sanjuana Kava, MD 3/27/20248:52 AM

## 2022-05-10 ENCOUNTER — Ambulatory Visit: Payer: BC Managed Care – PPO | Admitting: Orthopaedic Surgery

## 2022-05-18 ENCOUNTER — Ambulatory Visit: Payer: BC Managed Care – PPO | Admitting: Orthopaedic Surgery

## 2023-07-19 ENCOUNTER — Encounter (HOSPITAL_COMMUNITY): Payer: Self-pay

## 2023-07-19 ENCOUNTER — Emergency Department (HOSPITAL_COMMUNITY): Payer: Self-pay

## 2023-07-19 ENCOUNTER — Emergency Department (HOSPITAL_COMMUNITY)
Admission: EM | Admit: 2023-07-19 | Discharge: 2023-07-19 | Disposition: A | Discharge status: Home / Self Care | Payer: Self-pay | Attending: Student | Admitting: Student

## 2023-07-19 ENCOUNTER — Other Ambulatory Visit: Payer: Self-pay

## 2023-07-19 DIAGNOSIS — F17210 Nicotine dependence, cigarettes, uncomplicated: Secondary | ICD-10-CM | POA: Insufficient documentation

## 2023-07-19 DIAGNOSIS — M5412 Radiculopathy, cervical region: Secondary | ICD-10-CM | POA: Insufficient documentation

## 2023-07-19 DIAGNOSIS — M5030 Other cervical disc degeneration, unspecified cervical region: Secondary | ICD-10-CM | POA: Insufficient documentation

## 2023-07-19 MED ORDER — GABAPENTIN 100 MG PO TABS: 100.0000 mg | ORAL_TABLET | Freq: Three times a day (TID) | ORAL | 11 refills | Status: AC

## 2023-07-19 MED ORDER — LORAZEPAM 1 MG PO TABS: 1.0000 mg | ORAL_TABLET | Freq: Once | ORAL | Status: DC | PRN

## 2023-07-19 MED ORDER — METHYLPREDNISOLONE 4 MG PO TBPK: ORAL_TABLET | ORAL | 0 refills | Status: AC

## 2023-07-19 MED ORDER — KETOROLAC TROMETHAMINE 15 MG/ML IJ SOLN
15.0000 mg | Freq: Once | INTRAMUSCULAR | Status: AC
  Administered 2023-07-19: 15 mg via INTRAMUSCULAR
  Filled 2023-07-19: qty 1

## 2023-07-19 MED ORDER — DEXAMETHASONE SODIUM PHOSPHATE 10 MG/ML IJ SOLN
10.0000 mg | Freq: Once | INTRAMUSCULAR | Status: AC
  Administered 2023-07-19: 10 mg via INTRAMUSCULAR
  Filled 2023-07-19: qty 1

## 2023-07-19 NOTE — ED Triage Notes (Addendum)
 Pt reports pain that radiates from her  right shoulder down her arm and is exacerbated by turning her neck and trying to lift her arm and she believes sh has a pinched nerve in her neck.  Pt report this has happened intermittently for about a year but has been steady for the past week or so.

## 2023-07-19 NOTE — ED Provider Notes (Signed)
 [place] [facility] AT [facility] Provider Note  CSN: [id number] Arrival date & time: 07/19/23 1148  Chief Complaint(s) Shoulder Pain  HPI Nance Mccombs is a 28 y.o. female who presents emerged part for evaluation of arm and shoulder pain with numbness.  States that she has been having symptoms intermittently over the last 1 year but in the last 48 hours she has had significant worsening.  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  States that numbness extends from the mid arm all the way down involving all the fingers.  Denies trauma to shoulder.  Denies chest pain, shortness of breath, abdominal pain, nausea, vomiting or other systemic symptoms.   Past Medical History History reviewed. No pertinent past medical history. There are no active problems to display for this patient.  Home Medication(s) Prior to Admission medications   Medication Sig Start Date End Date Taking? Authorizing Provider  acetaminophen  (TYLENOL ) 500 MG tablet Take 1,000 mg by mouth every 6 (six) hours as needed for moderate pain (pain score 4-6).   Yes [provider]  gabapentin (GABARONE) 100 MG tablet Take 1 tablet (100 mg total) by mouth 3 (three) times daily. 07/19/23 07/18/24 Yes Whisper Kurka, MD  methylPREDNISolone (MEDROL DOSEPAK) 4 MG TBPK tablet Take as prescribed 07/19/23  Yes Shilpa Bushee, MD                                                                                                                                    Past Surgical History History reviewed. No pertinent surgical history. Family History History reviewed. No pertinent family history.  Social History Social History   Tobacco Use   Smoking status: Some Days    Types: Cigars   Smokeless tobacco: Never  Vaping Use   Vaping status: Never Used  Substance Use Topics   Alcohol use: No   Drug use: No   Allergies Patient has no  known allergies.  Review of Systems Review of Systems  Neurological:  Positive for weakness and numbness.    Physical Exam Vital Signs  I have reviewed the triage vital signs BP 128/80 (BP Location: Right Arm)   Pulse 64   Temp 98.2 F (36.8 C) (Oral)   Resp 16   Ht 6' (1.829 m)   Wt 113.4 kg   LMP 05/31/2023 (Approximate)   SpO2 100%   BMI 33.91 kg/m   Physical Exam Vitals and nursing note reviewed.  Constitutional:      General: She is not in acute distress.    Appearance: She is well-developed.  HENT:     Head: Normocephalic and atraumatic.   Eyes:     Conjunctiva/sclera: Conjunctivae normal.    Cardiovascular:     Rate and Rhythm: Normal rate and regular rhythm.     Heart sounds: No murmur heard. Pulmonary:  Effort: Pulmonary effort is normal. No respiratory distress.     Breath sounds: Normal breath sounds.  Abdominal:     Palpations: Abdomen is soft.     Tenderness: There is no abdominal tenderness.   Musculoskeletal:        General: No swelling.     Cervical back: Neck supple.   Skin:    General: Skin is warm and dry.     Capillary Refill: Capillary refill takes less than 2 seconds.   Neurological:     Mental Status: She is alert.     Sensory: Sensory deficit present.     Motor: Weakness present.   Psychiatric:        Mood and Affect: Mood normal.     ED Results and Treatments Labs (all labs ordered are listed, but only abnormal results are displayed) Labs Reviewed - No data to display                                                                                                                        Radiology MR Cervical Spine Wo Contrast Result Date: 07/19/2023 CLINICAL DATA:  Cervical radiculopathy, no red flags EXAM: MRI CERVICAL SPINE WITHOUT CONTRAST TECHNIQUE: Multiplanar, multisequence MR imaging of the cervical spine was performed. No intravenous contrast was administered. COMPARISON:  None Available. FINDINGS: Alignment: Mild  reversal of the normal cervical lordosis. Vertebrae: Intact and normal in signal intensity. No osseous lesions present. Cord: Normal in morphology and signal intensity. Posterior Fossa, vertebral arteries, paraspinal tissues: Unremarkable. Disc levels: C2-3: Normal. C3-4: Central disc protrusion and craniad extrusion, which indents the ventral surface of the spinal cord and causes moderate to severe central spinal canal stenosis. The neural foramina are patent. C4-5: Right-sided uncovertebral joint hypertrophy and facet arthrosis, causing moderate right neural foraminal stenosis. The central spinal canal and left neural foramina are patent. C5-6: Left paracentral disc protrusion indenting the ventral thecal sac and causing mild to moderate left-sided spinal canal stenosis. The neural foramina are patent. C6-7: Mild disc bulging with mild central spinal canal stenosis. The neural foramina are widely patent. C7-T1: Normal. IMPRESSION: 1. Central disc protrusion and craniad extrusion at C3-4, impinging upon the spinal cord centrally. 2. Right-sided uncovertebral joint hypertrophy and facet arthrosis at C4-5, with moderate right neural foraminal stenosis and possible nerve root impingement. Electronically Signed   By: Maribeth Shivers M.D.   On: 07/19/2023 14:26   MR BRAIN WO CONTRAST Result Date: 07/19/2023 CLINICAL DATA:  Neuro deficit, acute, stroke suspected. Headache and right arm numbness. EXAM: MRI HEAD WITHOUT CONTRAST TECHNIQUE: Multiplanar, multiecho pulse sequences of the brain and surrounding structures were obtained without intravenous contrast. COMPARISON:  None Available. FINDINGS: Brain: Normal brain. No evidence of hemorrhage, mass, cortical infarct or hydrocephalus. Vascular: Normal flow voids. Skull and upper cervical spine: Normal marrow signal. Sinuses/Orbits: Polypoid mucosa density within the floor of the left maxillary sinus. Normal orbits. Other: None. IMPRESSION: 1. Normal brain. 2. Mucous  retention cyst within the floor  the left maxillary sinus. Electronically Signed   By: Maribeth Shivers M.D.   On: 07/19/2023 14:17    Pertinent labs & imaging results that were available during my care of the patient were reviewed by me and considered in my medical decision making (see MDM for details).  Medications Ordered in ED Medications  LORazepam (ATIVAN) tablet 1 mg (has no administration in time range)  ketorolac (TORADOL) 15 MG/ML injection 15 mg (15 mg Intramuscular Given 07/19/23 1233)  dexamethasone (DECADRON) injection 10 mg (10 mg Intramuscular Given 07/19/23 1513)                                                                                                                                     Procedures Procedures  (including critical care time)  Medical Decision Making / ED Course   This patient presents to the ED for concern of arm weakness, numbness, this involves an extensive number of treatment options, and is a complaint that carries with it a high risk of complications and morbidity.  The differential diagnosis includes cervical radiculopathy, cervical stenosis, disc bulge, CVA, thoracic outlet syndrome, peripheral neuropraxia  MDM: Patient seen emergency room for evaluation of arm numbness and weakness.  Physical exam with poor sharp soft differentiation in the distal tip of the hand overall 5 fingers, 4/5 strength of the right hand and upper extremity.  With abnormal neurologic exam, advanced imaging was pursued including MRI brain and C-spine that shows a central disc protrusion and craniad extrusion at C3-C4 impinging on the spinal cord centrally with moderate right neural foraminal stenosis and nerve root impingement.  I spoke with the neurosurgical nurse practitioner on-call Maritza Sidles who is recommending Medrol Dosepak, gabapentin and close outpatient follow-up in the neurosurgery clinic.  She states she will text the scheduler to help arrange close outpatient  follow-up.  Patient given Decadron and Toradol here and will be discharged with gabapentin and Medrol.  At time she does not meet inpatient criteria for admission and will be discharged with outpatient follow-up.  Return precautions given which she voiced understanding   Additional history obtained:  -External records from outside source obtained and reviewed including: Chart review including previous notes, labs, imaging, consultation notes   Imaging Studies ordered: I ordered imaging studies including MRI brain and C-spine I independently visualized and interpreted imaging. I agree with the radiologist interpretation   Medicines ordered and prescription drug management: Meds ordered this encounter  Medications   ketorolac (TORADOL) 15 MG/ML injection 15 mg   LORazepam (ATIVAN) tablet 1 mg   dexamethasone (DECADRON) injection 10 mg   methylPREDNISolone (MEDROL DOSEPAK) 4 MG TBPK tablet    Sig: Take as prescribed    Dispense:  1 each    Refill:  0   gabapentin (GABARONE) 100 MG tablet    Sig: Take 1 tablet (100 mg total) by mouth 3 (three) times daily.    Dispense:  90  tablet    Refill:  11    -I have reviewed the patients home medicines and have made adjustments as needed  Critical interventions none  Consultations Obtained: I requested consultation with the neurosurgery NP on-call,  and discussed lab and imaging findings as well as pertinent plan - they recommend: Steroids, gabapentin, close outpatient follow-up   Cardiac Monitoring: The patient was maintained on a cardiac monitor.  I personally viewed and interpreted the cardiac monitored which showed an underlying rhythm of: NSR  Social Determinants of Health:  Factors impacting patients care include: none   Reevaluation: After the interventions noted above, I reevaluated the patient and found that they have :stayed the same  Co morbidities that complicate the patient evaluation History reviewed. No pertinent past  medical history.    Dispostion: I considered admission for this patient, and at this time she does not meet inpatient criteria for admission but will require close outpatient neurosurgical follow-up     Final Clinical Impression(s) / ED Diagnoses Final diagnoses:  Bulging of cervical intervertebral disc  Cervical radiculopathy     @PCDICTATION @    Karlyn Overman, MD 07/19/23 2053

## 2023-09-21 ENCOUNTER — Encounter: Payer: Self-pay | Admitting: Radiology

## 2023-12-03 ENCOUNTER — Encounter: Payer: Self-pay | Admitting: Radiology
